# Patient Record
Sex: Female | Born: 1945 | Race: White | Marital: Married | Smoking: Current every day smoker
Health system: Southern US, Community
[De-identification: ages and names within clinical notes are randomized; demographics above are authoritative.]

## PROBLEM LIST (undated history)

## (undated) DIAGNOSIS — I10 Essential (primary) hypertension: Secondary | ICD-10-CM

## (undated) DIAGNOSIS — E7800 Pure hypercholesterolemia, unspecified: Secondary | ICD-10-CM

## (undated) HISTORY — PX: ADENOIDECTOMY: SUR15

## (undated) HISTORY — PX: TONSILLECTOMY: SUR1361

## (undated) HISTORY — PX: ABDOMINAL HYSTERECTOMY: SHX81

---

## 2011-09-03 ENCOUNTER — Other Ambulatory Visit: Payer: Self-pay | Admitting: Internal Medicine

## 2011-09-03 DIAGNOSIS — R911 Solitary pulmonary nodule: Secondary | ICD-10-CM

## 2011-09-10 ENCOUNTER — Ambulatory Visit
Admission: RE | Admit: 2011-09-10 | Discharge: 2011-09-10 | Disposition: A | Discharge status: Home / Self Care | Payer: 59 | Source: Ambulatory Visit | Attending: Internal Medicine | Admitting: Internal Medicine

## 2011-09-10 DIAGNOSIS — R911 Solitary pulmonary nodule: Secondary | ICD-10-CM

## 2012-03-26 ENCOUNTER — Emergency Department (HOSPITAL_COMMUNITY): Payer: 59

## 2012-03-26 ENCOUNTER — Encounter (HOSPITAL_COMMUNITY): Payer: Self-pay | Admitting: Family Medicine

## 2012-03-26 ENCOUNTER — Inpatient Hospital Stay (HOSPITAL_COMMUNITY)
Admission: EM | Admit: 2012-03-26 | Discharge: 2012-03-28 | DRG: 641 | Disposition: A | Discharge status: Home / Self Care | Payer: 59 | Attending: Family Medicine | Admitting: Family Medicine

## 2012-03-26 DIAGNOSIS — E871 Hypo-osmolality and hyponatremia: Principal | ICD-10-CM | POA: Diagnosis present

## 2012-03-26 DIAGNOSIS — R55 Syncope and collapse: Secondary | ICD-10-CM | POA: Diagnosis present

## 2012-03-26 DIAGNOSIS — E785 Hyperlipidemia, unspecified: Secondary | ICD-10-CM | POA: Diagnosis present

## 2012-03-26 DIAGNOSIS — I951 Orthostatic hypotension: Secondary | ICD-10-CM | POA: Diagnosis present

## 2012-03-26 DIAGNOSIS — R21 Rash and other nonspecific skin eruption: Secondary | ICD-10-CM | POA: Diagnosis present

## 2012-03-26 DIAGNOSIS — E869 Volume depletion, unspecified: Secondary | ICD-10-CM | POA: Diagnosis present

## 2012-03-26 DIAGNOSIS — I10 Essential (primary) hypertension: Secondary | ICD-10-CM | POA: Diagnosis present

## 2012-03-26 DIAGNOSIS — Z7982 Long term (current) use of aspirin: Secondary | ICD-10-CM

## 2012-03-26 DIAGNOSIS — F17200 Nicotine dependence, unspecified, uncomplicated: Secondary | ICD-10-CM | POA: Diagnosis present

## 2012-03-26 DIAGNOSIS — Z79899 Other long term (current) drug therapy: Secondary | ICD-10-CM

## 2012-03-26 DIAGNOSIS — E7800 Pure hypercholesterolemia, unspecified: Secondary | ICD-10-CM | POA: Diagnosis present

## 2012-03-26 HISTORY — DX: Essential (primary) hypertension: I10

## 2012-03-26 HISTORY — DX: Pure hypercholesterolemia, unspecified: E78.00

## 2012-03-26 LAB — COMPREHENSIVE METABOLIC PANEL
ALT: 11 U/L (ref 0–35)
AST: 16 U/L (ref 0–37)
Albumin: 4 g/dL (ref 3.5–5.2)
Alkaline Phosphatase: 86 U/L (ref 39–117)
GFR calc Af Amer: >90 mL/min (ref >90)
Glucose, Bld: 132 mg/dL — ABNORMAL HIGH (ref 70–99)
Potassium: 4.9 mEq/L (ref 3.5–5.1)
Sodium: 125 mEq/L — ABNORMAL LOW (ref 135–145)
Total Protein: 6.8 g/dL (ref 6.0–8.3)

## 2012-03-26 LAB — POCT I-STAT TROPONIN I: Troponin i, poc: 0 ng/mL (ref 0.00–0.08)

## 2012-03-26 LAB — CBC WITH DIFFERENTIAL/PLATELET
Basophils Absolute: 0.1 10*3/uL (ref 0.0–0.1)
Eosinophils Absolute: 0.5 10*3/uL (ref 0.0–0.7)
Lymphs Abs: 1.7 10*3/uL (ref 0.7–4.0)
MCH: 33.6 pg (ref 26.0–34.0)
Neutrophils Relative %: 75 % (ref 43–77)
Platelets: 343 10*3/uL (ref 150–400)
RBC: 4.31 MIL/uL (ref 3.87–5.11)
RDW: 12.1 % (ref 11.5–15.5)
WBC: 13.7 10*3/uL — ABNORMAL HIGH (ref 4.0–10.5)

## 2012-03-26 MED ORDER — SODIUM CHLORIDE 0.9 % IV BOLUS (SEPSIS)
1000.0000 mL | Freq: Once | INTRAVENOUS | Status: AC
  Administered 2012-03-26: 1000 mL via INTRAVENOUS

## 2012-03-26 NOTE — ED Notes (Signed)
Pt returned from radiology.

## 2012-03-26 NOTE — ED Notes (Signed)
Per EMS, pt had syncopal episode at approx 1945 upon standing up from sitting position. Husband witnessed episode and told EMS that pt did not hit head. Husband stated that after the fall, pt exhibited decerebrate posturing and "took a few minutes to become fully conscious." Per EMS pt in LBBB on 12 lead. Pt has red rash all over body. Positive ETOH, per EMS pt drank a six pack this afternoon. Pt alert and interactive on arrival.  EMS placed an 18gauge to the left AC fluids running.  O2 sats 98% on RA, CBG=120,  NIH was 0.

## 2012-03-26 NOTE — ED Notes (Signed)
Dr. Tanna Savoy at bedside for assessment

## 2012-03-26 NOTE — ED Provider Notes (Signed)
History     CSN: [id number]  Arrival date & time 03/26/12  2039   First MD Initiated Contact with Patient 03/26/12 2101      Chief Complaint  Patient presents with  . Loss of Consciousness    (Consider location/radiation/quality/duration/timing/severity/associated sxs/prior treatment) HPI Comments: Patient states she started to feel warm and hot all over and mildly generally weak. She decided she would stand up and go to sit in her chair and not last thing she remembers. Husband found her lying on the floor unresponsive for a short period of time he states no more than a minute. She states come back around and then tightened her arms and was making a gurgling noise with her mouth. This lasted for approximately 20 seconds and then she started to respond and was able to grip his hands bilaterally and answer questions. She states she has drinks 6 beers this afternoon which is not unusual for her she had not been eating regularly because of the power being out  Patient is a 67 y.o. female presenting with syncope. The history is provided by the patient.  Loss of Consciousness  This is a new problem. The current episode started less than 1 hour ago. The problem occurs constantly. The problem has been resolved. She lost consciousness for a period of less than one minute. The problem is associated with standing up. Associated symptoms include diaphoresis, dizziness, light-headedness and weakness. Pertinent negatives include back pain, bladder incontinence, bowel incontinence, chest pain, confusion, fever, focal weakness, headaches, nausea, palpitations, slurred speech, visual change and vomiting. She has tried relaxation for the symptoms. The treatment provided significant relief. Her past medical history is significant for HTN. Her past medical history does not include CAD, CVA, DM or seizures.    Past Medical History  Diagnosis Date  . Hypertension   . High cholesterol     Past Surgical History   Procedure Laterality Date  . Tonsillectomy    . Abdominal hysterectomy    . Adenoidectomy      No family history on file.  History  Substance Use Topics  . Smoking status: Current Every Day Smoker  . Smokeless tobacco: Not on file  . Alcohol Use: 0.0 oz/week     Comment: Daily drinker, states "sometimes I don't feel like drinking, but I usually have a few every night"    OB History   Grav Para Term Preterm Abortions TAB SAB Ect Mult Living                  Review of Systems  Constitutional: Positive for diaphoresis. Negative for fever.  Eyes: Negative for photophobia and visual disturbance.  Respiratory: Negative for cough and shortness of breath.   Cardiovascular: Positive for syncope. Negative for chest pain and palpitations.  Gastrointestinal: Negative for nausea, vomiting and bowel incontinence.  Genitourinary: Negative for bladder incontinence.  Musculoskeletal: Negative for back pain.  Skin: Positive for rash.       Rash all over the with itching.  Treated for scabies at PCP which did not help and recently started keflex which has improved rash.  Neurological: Positive for dizziness, weakness and light-headedness. Negative for focal weakness and headaches.  Psychiatric/Behavioral: Negative for confusion.  All other systems reviewed and are negative.    Allergies  Review of patient's allergies indicates no known allergies.  Home Medications   Current Outpatient Rx  Name  Route  Sig  Dispense  Refill  . aspirin 81 MG tablet   Oral  Take 81 mg by mouth daily.         . cephALEXin (KEFLEX) 250 MG capsule   Oral   Take 250 mg by mouth 3 (three) times daily. Started medication on Wednesday 02-25-12. Take for 10 days         . cholecalciferol (VITAMIN D) 1000 UNITS tablet   Oral   Take 1,000 Units by mouth 2 (two) times daily.         Marland Kitchen CROMOLYN SODIUM PO   Oral   Take 1 tablet by mouth daily.         . hydrOXYzine (ATARAX/VISTARIL) 25 MG  tablet   Oral   Take 25 mg by mouth daily as needed for itching. For itching         . meloxicam (MOBIC) 15 MG tablet   Oral   Take 15 mg by mouth daily as needed for pain. For pain/muscle aches           BP 103/62  Pulse 91  Temp(Src) 98.5 F (36.9 C) (Oral)  Resp 18  Physical Exam  Nursing note and vitals reviewed. Constitutional: She is oriented to person, place, and time. She appears well-developed and well-nourished. No distress.  HENT:  Head: Normocephalic and atraumatic.  Mouth/Throat: Oropharynx is clear and moist.  Eyes: Conjunctivae and EOM are normal. Pupils are equal, round, and reactive to light.  Neck: Normal range of motion. Neck supple.  Cardiovascular: Normal rate, regular rhythm and intact distal pulses.   No murmur heard. Pulmonary/Chest: Effort normal. No respiratory distress. She has no wheezes. She has rales.  Abdominal: Soft. She exhibits no distension. There is no tenderness. There is no rebound and no guarding.  Musculoskeletal: Normal range of motion. She exhibits no edema and no tenderness.  Neurological: She is alert and oriented to person, place, and time.  Skin: Skin is warm and dry. Rash noted. Rash is maculopapular and pustular. No erythema.  Macular papular, pustular weeping rash involving bilateral upper extremities, chest, back and bilateral lower extremity  Psychiatric: She has a normal mood and affect. Her behavior is normal.    ED Course  Procedures (including critical care time)  Labs Reviewed  CBC WITH DIFFERENTIAL - Abnormal; Notable for the following:    WBC 13.7 (*)    MCHC 36.3 (*)    Neutro Abs 10.3 (*)    Monocytes Absolute 1.1 (*)    All other components within normal limits  COMPREHENSIVE METABOLIC PANEL - Abnormal; Notable for the following:    Sodium 125 (*)    Chloride 88 (*)    Glucose, Bld 132 (*)    Total Bilirubin 0.2 (*)    GFR calc non Af Amer 87 (*)    All other components within normal limits  ETHANOL   URINALYSIS, ROUTINE W REFLEX MICROSCOPIC  POCT I-STAT TROPONIN I   Dg Chest 2 View  03/26/2012  *RADIOLOGY REPORT*  Clinical Data: Syncope; weakness and chest pain.  History of smoking.  CHEST - 2 VIEW  Comparison: Chest radiograph performed 09/03/2011, and CT of the chest performed 09/10/2011  Findings: The lungs are well-aerated.  Minimal vague peripheral opacities are similar in appearance to the prior studies; this could reflect chronic underlying interstitial lung disease and fibrotic change, as previously noted.  No acute superimposed airspace consolidation is seen.  There is no evidence of pleural effusion or pneumothorax.  The heart is normal in size; the mediastinal contour is within normal limits.  No acute osseous  abnormalities are seen.  IMPRESSION: Stable appearance to minimal vague peripheral opacities; this could reflect chronic underlying interstitial lung disease and fibrotic change, as previously noted.  No acute superimposed airspace consolidation seen.   Original Report Authenticated By: Tonia Ghent, M.D.    Ct Head Wo Contrast  03/26/2012  *RADIOLOGY REPORT*  Clinical Data:  Syncope  CT HEAD WITHOUT CONTRAST  Technique:  Contiguous axial images were obtained from the base of the skull through the vertex without contrast.  Comparison: None.  Findings: No acute intracranial hemorrhage, acute infarction, mass lesion, mass effect, midline shift or hydrocephalus.  Gray-white differentiation is preserved throughout. The globes and orbits are within normal limits.  No focal soft tissue or calvarial abnormality.  There are atherosclerotic vascular calcifications within the bilateral cavernous carotid arteries.  Mastoid air cells and paranasal sinuses are well-aerated.  IMPRESSION:  1.  Negative CT scan of the head. 2.  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   Original Report Authenticated By: Malachy Moan, M.D.      Date: 03/26/2012  Rate: 70  Rhythm: normal  sinus rhythm  QRS Axis: right  Intervals: PR shortened  ST/T Wave abnormalities: normal  Conduction Disutrbances:none  Narrative Interpretation:   Old EKG Reviewed: none available   1. Syncope   2. Hyponatremia       MDM  Patient with an episode of loss of consciousness today x2. And she states she had drank 6 beers today and had knee much because the power was out. She started to feel weak and lightheaded and stood up announced last thing she remembered. Husband found her face down and he sat her up at which time she came back around and then he states he's she kind her arms and when out again. She came around in 20-30 seconds. Patient has a history of syncope or seizures. She states she typically drinks 6 beers daily and that is not unusual for her. She's recently been on Atarax and Keflex for a skin rash which she states is improving. She denies any fever or feeling abnormal today. Blood sugar on arrival was within normal limits at 120. Patient has no known cardiac history however she is hypertension, hyper cholesterolemia, and tobacco abuser.  Feel most likely this was syncopal event lower suspicion for seizure.  CBC, CMP, UA, EtOH, troponin, chest x-ray pending. EKG showed a mild shortened PR interval but otherwise within normal limits.  Patient's blood pressure is been borderline low. She was given IV fluids.        Gwyneth Sprout, MD 03/26/12 2310

## 2012-03-27 ENCOUNTER — Encounter (HOSPITAL_COMMUNITY): Payer: Self-pay | Admitting: *Deleted

## 2012-03-27 LAB — URINALYSIS, ROUTINE W REFLEX MICROSCOPIC
Bilirubin Urine: NEGATIVE
Glucose, UA: NEGATIVE mg/dL
Hgb urine dipstick: NEGATIVE
Ketones, ur: NEGATIVE mg/dL
Specific Gravity, Urine: 1.006 (ref 1.005–1.030)
pH: 6 (ref 5.0–8.0)

## 2012-03-27 LAB — BASIC METABOLIC PANEL
BUN: 8 mg/dL (ref 6–23)
CO2: 26 mEq/L (ref 19–32)
Calcium: 9.1 mg/dL (ref 8.4–10.5)
Creatinine, Ser: 0.69 mg/dL (ref 0.50–1.10)
GFR calc non Af Amer: 89 mL/min — ABNORMAL LOW (ref >90)
Glucose, Bld: 103 mg/dL — ABNORMAL HIGH (ref 70–99)

## 2012-03-27 LAB — TROPONIN I: Troponin I: <0.3 ng/mL (ref <0.30)

## 2012-03-27 MED ORDER — FOLIC ACID 1 MG PO TABS
1.0000 mg | ORAL_TABLET | Freq: Every day | ORAL | Status: DC
  Administered 2012-03-27 – 2012-03-28 (×2): 1 mg via ORAL
  Filled 2012-03-27 (×2): qty 1

## 2012-03-27 MED ORDER — LORAZEPAM 2 MG/ML IJ SOLN: 1.0000 mg | Freq: Four times a day (QID) | INTRAMUSCULAR | Status: DC | PRN

## 2012-03-27 MED ORDER — ASPIRIN 81 MG PO TABS: 81.0000 mg | ORAL_TABLET | Freq: Every day | ORAL | Status: DC

## 2012-03-27 MED ORDER — LORAZEPAM 2 MG/ML IJ SOLN: 0.0000 mg | Freq: Two times a day (BID) | INTRAMUSCULAR | Status: DC

## 2012-03-27 MED ORDER — ADULT MULTIVITAMIN W/MINERALS CH
1.0000 | ORAL_TABLET | Freq: Every day | ORAL | Status: DC
  Administered 2012-03-27 – 2012-03-28 (×2): 1 via ORAL
  Filled 2012-03-27 (×2): qty 1

## 2012-03-27 MED ORDER — LORAZEPAM 1 MG PO TABS: 1.0000 mg | ORAL_TABLET | Freq: Four times a day (QID) | ORAL | Status: DC | PRN

## 2012-03-27 MED ORDER — ASPIRIN 81 MG PO CHEW
81.0000 mg | CHEWABLE_TABLET | Freq: Every day | ORAL | Status: DC
  Administered 2012-03-27 – 2012-03-28 (×2): 81 mg via ORAL
  Filled 2012-03-27: qty 1

## 2012-03-27 MED ORDER — HYDROXYZINE HCL 25 MG PO TABS: 25.0000 mg | ORAL_TABLET | Freq: Every day | ORAL | Status: DC | PRN

## 2012-03-27 MED ORDER — SODIUM CHLORIDE 0.9 % IV SOLN
INTRAVENOUS | Status: DC
  Administered 2012-03-27: 05:00:00 via INTRAVENOUS

## 2012-03-27 MED ORDER — THIAMINE HCL 100 MG/ML IJ SOLN
100.0000 mg | Freq: Every day | INTRAMUSCULAR | Status: DC
  Filled 2012-03-27 (×2): qty 1

## 2012-03-27 MED ORDER — ENOXAPARIN SODIUM 40 MG/0.4ML ~~LOC~~ SOLN
40.0000 mg | SUBCUTANEOUS | Status: DC
  Administered 2012-03-27 – 2012-03-28 (×2): 40 mg via SUBCUTANEOUS
  Filled 2012-03-27 (×2): qty 0.4

## 2012-03-27 MED ORDER — VITAMIN B-1 100 MG PO TABS
100.0000 mg | ORAL_TABLET | Freq: Every day | ORAL | Status: DC
  Administered 2012-03-27 – 2012-03-28 (×2): 100 mg via ORAL
  Filled 2012-03-27 (×2): qty 1

## 2012-03-27 MED ORDER — ONDANSETRON HCL 4 MG/2ML IJ SOLN: 4.0000 mg | Freq: Four times a day (QID) | INTRAMUSCULAR | Status: DC | PRN

## 2012-03-27 MED ORDER — ONDANSETRON HCL 4 MG PO TABS: 4.0000 mg | ORAL_TABLET | Freq: Four times a day (QID) | ORAL | Status: DC | PRN

## 2012-03-27 MED ORDER — LORAZEPAM 2 MG/ML IJ SOLN: 0.0000 mg | Freq: Four times a day (QID) | INTRAMUSCULAR | Status: DC

## 2012-03-27 MED ORDER — SODIUM CHLORIDE 0.9 % IJ SOLN
3.0000 mL | Freq: Two times a day (BID) | INTRAMUSCULAR | Status: DC
  Administered 2012-03-27 – 2012-03-28 (×2): 3 mL via INTRAVENOUS

## 2012-03-27 NOTE — Progress Notes (Signed)
Utilization Review Completed.   Jasline Buskirk Tucker, RN, BSN Nurse Case Manager  [phone]  

## 2012-03-27 NOTE — Progress Notes (Signed)
PROGRESS NOTE  Niylah Hassan ZOX:[id number] DOB: 1945/02/10 DOA: 03/26/2012 PCP: No primary Emric Kowalewski on file.  Brief narrative: 67 yr old CF admitted with syncopal episode in a setting of diuretic use/Daily etoh use as well  Past medical history-As per Problem list Chart reviewed as below- No prior medical h/o  Consultants:  None currently  Procedures:  Ct head 3.7.14 neg for any acute issue  Cxr 2 view 3.7  Antibiotics:  None   Subjective  Patient doing well. States that she was seated playing computer games at home, then she got up and Felt diaphoretic weak and like she was going pass out She states she went to lay her feet up and then passed out. Her husband heard the commotion and came over and she got up and then had another spell where she lost consciousness briefly for 10/52 She was incontinent of urine She had no postictal state or any other concerns   Objective    Interim History: NAD  Telemetry: Normal sinus rhythm with PACs  Objective: Filed Vitals:   03/27/12 0000 03/27/12 0030 03/27/12 0100 03/27/12 0500  BP: 105/68 101/53 123/63 108/61  Pulse: 85 83 89   Temp:   99.5 F (37.5 C) 99.3 F (37.4 C)  TempSrc:   Oral Oral  Resp: 15 15 16    Height:   5\' 2"  (1.575 m)   Weight:   67.495 kg (148 lb 12.8 oz)   SpO2: 96% 96% 97% 94%    Intake/Output Summary (Last 24 hours) at 03/27/12 1317 Last data filed at 03/27/12 0700  Gross per 24 hour  Intake    240 ml  Output    800 ml  Net   -560 ml    Exam:  General: Pleasant oriented Caucasian female no apparent distress Cardiovascular: S1-S2 regular rate rhythm no murmur Respiratory: Clinically clear Abdomen: Soft nontender nondistended Skin no lower extremity edema Neuro is intact  Data Reviewed: Basic Metabolic Panel:  Recent Labs Lab 03/26/12 2115 03/27/12 0655  NA 125* 131*  K 4.9 4.1  CL 88* 95*  CO2 25 26  GLUCOSE 132* 103*  BUN 11 8  CREATININE 0.76 0.69  CALCIUM 9.4 9.1    Liver Function Tests:  Recent Labs Lab 03/26/12 2115  AST 16  ALT 11  ALKPHOS 86  BILITOT 0.2*  PROT 6.8  ALBUMIN 4.0   No results found for this basename: LIPASE, AMYLASE,  in the last 168 hours No results found for this basename: AMMONIA,  in the last 168 hours CBC:  Recent Labs Lab 03/26/12 2115  WBC 13.7*  NEUTROABS 10.3*  HGB 14.5  HCT 39.9  MCV 92.6  PLT 343   Cardiac Enzymes:  Recent Labs Lab 03/27/12 0205 03/27/12 0655  TROPONINI <0.30 <0.30   BNP: No components found with this basename: POCBNP,  CBG: No results found for this basename: GLUCAP,  in the last 168 hours  No results found for this or any previous visit (from the past 240 hour(s)).   Studies:              All Imaging reviewed and is as per above notation   Scheduled Meds: . aspirin  81 mg Oral Daily  . enoxaparin (LOVENOX) injection  40 mg Subcutaneous Q24H  . folic acid  1 mg Oral Daily  . LORazepam  0-4 mg Intravenous Q6H   Followed by  . [START ON 03/29/2012] LORazepam  0-4 mg Intravenous Q12H  . multivitamin with minerals  1  tablet Oral Daily  . sodium chloride  3 mL Intravenous Q12H  . thiamine  100 mg Oral Daily   Or  . thiamine  100 mg Intravenous Daily   Continuous Infusions: . sodium chloride 125 mL/hr at 03/27/12 0513     Assessment/Plan: 1. Likely orthostatic versus vasovagal syncope-patient's HCTZ has been discontinued. We will get orthostatic blood pressures.  I am not completely sure if this is not related to potential seizure activity given the urinary incontinence but as it was no tongue biting or further issues we will just monitor for now-continue saline 75 cc per hour-her telemetry does not show any concerns for arrhythmia causing this and she is normal sensory 2. Rash-potentially secondary to scabies-states that she's been using a new detergent recently and start over the left lower extremity and then progressed to the rest of the body closing the back-continue  Atarax 25 daily if this does not improve she needs to see a dermatologist 3. Ethanol habituation-C.IWa neg-she is on a recreational drinker therefore discontinue monitor 4. Borderline hypotension-probably constitutional.  Code Status: Full Family Communication: Long discussion about side with husband who is an EMT Disposition Plan: Inpatient   Pleas Koch, MD  [facility] Pager [phone] 03/27/2012, 1:17 PM    LOS: 1 day

## 2012-03-27 NOTE — H&P (Signed)
[facility] History and Physical  Beth Hinton ZOX:[id number] DOB: Mar 10, 1945    PCP:  None  Chief Complaint: Syncope  HPI: Beth Hinton is an 67 y.o. female with history of hypertension, hyperlipidemia, daily beer drinker (up to 6 beer per day, diuretic for hypertension, presents to the emergency room as she had a syncopal episode.  She was feeling lightheadedness and fainted. The loss of consciousness was quite transient.  She did not have any chest pain, palpitation, headache, or any neurological complaints. Evaluation in the emergency room included an EKG which showed normal sinus rhythm without any acute ST-T changes, a serum sodium of 125, with normal renal function tests. She does have mild leukocytosis with a white count of 13.7 thousand. Her chest x-ray showed no acute infiltrate. She also had a CT of the head which was negative as well.  She also had a burn on her left lower extremity and was treated with Silvadene cream. She has a intense itchy rash all over her body suspicious for scabies. She had one course of treatment with Kwell solution. The rash has crusted. The itching is still there. Hospitalist was asked to admit her for syncopal event along with hyponatremia.  Rewiew of Systems:  Constitutional: Negative for malaise, fever and chills. No significant weight loss or weight gain Eyes: Negative for eye pain, redness and discharge, diplopia, visual changes, or flashes of light. ENMT: Negative for ear pain, hoarseness, nasal congestion, sinus pressure and sore throat. No headaches; tinnitus, drooling, or problem swallowing. Cardiovascular: Negative for chest pain, palpitations, diaphoresis, dyspnea and peripheral edema. ; No orthopnea, PND Respiratory: Negative for cough, hemoptysis, wheezing and stridor. No pleuritic chestpain. Gastrointestinal: Negative for nausea, vomiting, diarrhea, constipation, abdominal pain, melena, blood in stool, hematemesis, jaundice and rectal  bleeding.    Genitourinary: Negative for frequency, dysuria, incontinence,flank pain and hematuria; Musculoskeletal: Negative for back pain and neck pain. Negative for swelling and trauma.;  Skin: . Negative for pruritus, rash, abrasions, bruising and skin lesion.; ulcerations Neuro: Negative for headache, and neck stiffness. Negative for weakness,  extremity weakness, burning feet, involuntary movement, seizure and syncope.  Psych: negative for anxiety, depression, insomnia, tearfulness, panic attacks, hallucinations, paranoia, suicidal or homicidal ideation    Past Medical History  Diagnosis Date  . Hypertension   . High cholesterol     Past Surgical History  Procedure Laterality Date  . Tonsillectomy    . Abdominal hysterectomy    . Adenoidectomy      Medications:  HOME MEDS: Prior to Admission medications   Medication Sig Start Date End Date Taking? Authorizing Provider  aspirin 81 MG tablet Take 81 mg by mouth daily.   Yes Historical Provider, MD  cephALEXin (KEFLEX) 250 MG capsule Take 250 mg by mouth 3 (three) times daily. Started medication on Wednesday 02-25-12. Take for 10 days   Yes Historical Provider, MD  cholecalciferol (VITAMIN D) 1000 UNITS tablet Take 1,000 Units by mouth 2 (two) times daily.   Yes Historical Provider, MD  CROMOLYN SODIUM PO Take 1 tablet by mouth daily.   Yes Historical Provider, MD  hydrOXYzine (ATARAX/VISTARIL) 25 MG tablet Take 25 mg by mouth daily as needed for itching. For itching   Yes Historical Provider, MD  meloxicam (MOBIC) 15 MG tablet Take 15 mg by mouth daily as needed for pain. For pain/muscle aches   Yes Historical Provider, MD     Allergies:  No Known Allergies  Social History:   reports that she has been smoking.  She has never used smokeless tobacco. She reports that  drinks alcohol. Her drug history is not on file.  Family History: No family history on file.   Physical Exam: Filed Vitals:   03/26/12 2315 03/26/12  2345 03/27/12 0000 03/27/12 0030  BP: 91/49 104/49 105/68 101/53  Pulse: 80 73 85 83  Temp:      TempSrc:      Resp: 15 18 15 15   SpO2: 96% 96% 96% 96%   Blood pressure 101/53, pulse 83, temperature 98.5 F (36.9 C), temperature source Oral, resp. rate 15, SpO2 96.00%.  GEN:  Pleasant  patient lying in the stretcher in no acute distress; cooperative with exam. PSYCH:  alert and oriented x4; does not appear anxious or depressed; affect is appropriate. HEENT: Mucous membranes pink and anicteric; PERRLA; EOM intact; no cervical lymphadenopathy nor thyromegaly or carotid bruit; no JVD; There were no stridor. Neck is very supple. Breasts:: Not examined CHEST WALL: No tenderness CHEST: Normal respiration, clear to auscultation bilaterally.  HEART: Regular rate and rhythm.  There are no murmur, rub, or gallops.   BACK: No kyphosis or scoliosis; no CVA tenderness ABDOMEN: soft and non-tender; no masses, no organomegaly, normal abdominal bowel sounds; no pannus; no intertriginous candida. There is no rebound and no distention. Rectal Exam: Not done EXTREMITIES: No bone or joint deformity; age-appropriate arthropathy of the hands and knees; no edema; no ulcerations.  There is no calf tenderness. Genitalia: not examined PULSES: 2+ and symmetric SKIN: Normal hydration no rash or ulceration CNS: Cranial nerves 2-12 grossly intact no focal lateralizing neurologic deficit.  Speech is fluent; uvula elevated with phonation, facial symmetry and tongue midline. DTR are normal bilaterally, cerebella exam is intact, barbinski is negative and strengths are equaled bilaterally.  No sensory loss.   Labs on Admission:  Basic Metabolic Panel:  Recent Labs Lab 03/26/12 2115  NA 125*  K 4.9  CL 88*  CO2 25  GLUCOSE 132*  BUN 11  CREATININE 0.76  CALCIUM 9.4   Liver Function Tests:  Recent Labs Lab 03/26/12 2115  AST 16  ALT 11  ALKPHOS 86  BILITOT 0.2*  PROT 6.8  ALBUMIN 4.0   No results  found for this basename: LIPASE, AMYLASE,  in the last 168 hours No results found for this basename: AMMONIA,  in the last 168 hours CBC:  Recent Labs Lab 03/26/12 2115  WBC 13.7*  NEUTROABS 10.3*  HGB 14.5  HCT 39.9  MCV 92.6  PLT 343   Cardiac Enzymes: No results found for this basename: CKTOTAL, CKMB, CKMBINDEX, TROPONINI,  in the last 168 hours  CBG: No results found for this basename: GLUCAP,  in the last 168 hours   Radiological Exams on Admission: Dg Chest 2 View  03/26/2012  *RADIOLOGY REPORT*  Clinical Data: Syncope; weakness and chest pain.  History of smoking.  CHEST - 2 VIEW  Comparison: Chest radiograph performed 09/03/2011, and CT of the chest performed 09/10/2011  Findings: The lungs are well-aerated.  Minimal vague peripheral opacities are similar in appearance to the prior studies; this could reflect chronic underlying interstitial lung disease and fibrotic change, as previously noted.  No acute superimposed airspace consolidation is seen.  There is no evidence of pleural effusion or pneumothorax.  The heart is normal in size; the mediastinal contour is within normal limits.  No acute osseous abnormalities are seen.  IMPRESSION: Stable appearance to minimal vague peripheral opacities; this could reflect chronic underlying interstitial lung disease and fibrotic  change, as previously noted.  No acute superimposed airspace consolidation seen.   Original Report Authenticated By: Tonia Ghent, M.D.    Ct Head Wo Contrast  03/26/2012  *RADIOLOGY REPORT*  Clinical Data:  Syncope  CT HEAD WITHOUT CONTRAST  Technique:  Contiguous axial images were obtained from the base of the skull through the vertex without contrast.  Comparison: None.  Findings: No acute intracranial hemorrhage, acute infarction, mass lesion, mass effect, midline shift or hydrocephalus.  Gray-white differentiation is preserved throughout. The globes and orbits are within normal limits.  No focal soft tissue or  calvarial abnormality.  There are atherosclerotic vascular calcifications within the bilateral cavernous carotid arteries.  Mastoid air cells and paranasal sinuses are well-aerated.  IMPRESSION:  1.  Negative CT scan of the head. 2.  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   Original Report Authenticated By: Malachy Moan, M.D.     EKG: Independently reviewed. Normal sinus rhythm without any acute ST-T changes   Assessment/Plan Present on Admission:  . Hyponatremia . HTN (hypertension) . Syncope and collapse . Volume depletion . Rash of entire body  PLAN:  I think the syncopal episode is likely because of volume depletion. She has been on diuretic for her hypertension and drink significant amount of alcohol daily. Her hyponatremia is likely due to beer potomania aggravated by diuretics.  I suspect the rash is scabies. Although it is treated, residual itchiness will persist. We'll admit her for syncopal workup including telemetry and rule out for MI. She will be given intravenous fluid with normal saline. I have stopped her lisinopril with HCTZ for now. She is stable, full code, and will be [facility].  Thank you for allowing me to participate in the care of this nice patient   Other plans as per orders.  Code Status: Full code   LE,PETER, MD. [facility] Pager [phone] 7pm to 7am.  03/27/2012, 1:35 AM

## 2012-03-28 LAB — BASIC METABOLIC PANEL
BUN: 7 mg/dL (ref 6–23)
Creatinine, Ser: 0.63 mg/dL (ref 0.50–1.10)
GFR calc non Af Amer: >90 mL/min (ref >90)
Glucose, Bld: 114 mg/dL — ABNORMAL HIGH (ref 70–99)
Potassium: 3.9 mEq/L (ref 3.5–5.1)

## 2012-03-28 NOTE — Discharge Summary (Signed)
Physician Discharge Summary  Beth Hinton RUE:[id number] DOB: 1945/09/13 DOA: 03/26/2012  PCP: No primary provider on file.  Admit date: 03/26/2012 Discharge date: 03/28/2012  Time spent: 20 minutes  Recommendations for Outpatient Follow-up:  1. Recommend outpatient followup for rash 2. Would discontinue all antihypertensives even she was somewhat hypotensive in the hospital 3. Please check basic metabolic panel in one to 2 weeks 4. Patient followup with primary care physician  Discharge Diagnoses:  Principal Problem:   Hyponatremia Active Problems:   HTN (hypertension)   Syncope and collapse   Volume depletion   Rash of entire body   Discharge Condition:  stable  Diet recommendation:  heart healthy   Filed Weights   03/27/12 0100 03/28/12 0635  Weight: 67.495 kg (148 lb 12.8 oz) 67.2 kg (148 lb 2.4 oz)    History of present illness:  66-yeold Caucasian female admitted 03/27/2012 with a syncopal episode. She was seated at her computer table playing a computer game  and felt a little woozy and then seemed to have fallen to the floor. No nausea consciousness patient was completely oriented but then had a second spell prompting her husband to bring her over to the hospital given she seemed a little bit more confused after the second spell.   Evaluation in the emergency room included an EKG which showed normal sinus rhythm without any acute ST-T changes, a serum sodium of 125, with normal renal function tests. She does have mild leukocytosis with a white count of 13.7 thousand. Her chest x-ray showed no acute infiltrate. She also had a CT of the head which was negative as well    Hospital Course:    1. Likely orthostatic versus vasovagal syncope-patient's Lisinopril-HCTZ has been discontinued. Borderline positive orthostatic blood pressures. I am not completely sure if this is not related to potential seizure activity given the urinary incontinence but as it was no tongue biting or  further issues she was kept on IV saline and did relatively well. She had no further episodes. Antihypertensives were discontinued 2. Hyponatremia sodium on admission was 125-patient was kept on IV saline and this increased on discharge 134. 3. Rash-potentially secondary to scabies-states that she's been using a new detergent recently and start over the left lower extremity and then progressed to the rest of the body closing the back-continue Atarax 25 daily if this does not improve she needs to see a dermatologist- suspect potentially higher dosages of Atarax may have been giving her anticholinergic effects as well 4. Ethanol habituation-C.IWa neg-she is on a recreational drinker therefore discontinue monitor 5. Borderline hypotension-probably constitutional-possibly contributed to #1   Consultants:  None currently  Procedures:  Ct head 3.7.14 neg for any acute issue  Cxr 2 view 3.7  Antibiotics:  None   Discharge Exam: Filed Vitals:   03/27/12 0500 03/27/12 1434 03/27/12 2100 03/28/12 0635  BP: 108/61 116/64 118/57 127/60  Pulse:  77 78 74  Temp: 99.3 F (37.4 C) 98.2 F (36.8 C) 99.3 F (37.4 C) 98.9 F (37.2 C)  TempSrc: Oral Oral Oral Oral  Resp:  20 18 18   Height:      Weight:    67.2 kg (148 lb 2.4 oz)  SpO2: 94% 98% 96% 97%    alert pleasant oriented no further issues at present. Has been ambulating to the bathroom and back. And nausea no vomiting no chest pain  General:  alert pleasant Caucasian female no apparent distress  Cardiovascular:  S1-S2 no murmur rub or gallop  Respiratory:  clinically clear   Discharge Instructions  Discharge Orders   Future Orders Complete By Expires     Diet - low sodium heart healthy  As directed     Increase activity slowly  As directed         Medication List    STOP taking these medications       cephALEXin 250 MG capsule  Commonly known as:  KEFLEX      TAKE these medications       aspirin 81 MG tablet  Take 81  mg by mouth daily.     cholecalciferol 1000 UNITS tablet  Commonly known as:  VITAMIN D  Take 1,000 Units by mouth 2 (two) times daily.     CROMOLYN SODIUM PO  Take 1 tablet by mouth daily.     hydrOXYzine 25 MG tablet  Commonly known as:  ATARAX/VISTARIL  Take 25 mg by mouth daily as needed for itching. For itching     meloxicam 15 MG tablet  Commonly known as:  MOBIC  Take 15 mg by mouth daily as needed for pain. For pain/muscle aches          The results of significant diagnostics from this hospitalization (including imaging, microbiology, ancillary and laboratory) are listed below for reference.    Significant Diagnostic Studies: Dg Chest 2 View  03/26/2012  *RADIOLOGY REPORT*  Clinical Data: Syncope; weakness and chest pain.  History of smoking.  CHEST - 2 VIEW  Comparison: Chest radiograph performed 09/03/2011, and CT of the chest performed 09/10/2011  Findings: The lungs are well-aerated.  Minimal vague peripheral opacities are similar in appearance to the prior studies; this could reflect chronic underlying interstitial lung disease and fibrotic change, as previously noted.  No acute superimposed airspace consolidation is seen.  There is no evidence of pleural effusion or pneumothorax.  The heart is normal in size; the mediastinal contour is within normal limits.  No acute osseous abnormalities are seen.  IMPRESSION: Stable appearance to minimal vague peripheral opacities; this could reflect chronic underlying interstitial lung disease and fibrotic change, as previously noted.  No acute superimposed airspace consolidation seen.   Original Report Authenticated By: Tonia Ghent, M.D.    Ct Head Wo Contrast  03/26/2012  *RADIOLOGY REPORT*  Clinical Data:  Syncope  CT HEAD WITHOUT CONTRAST  Technique:  Contiguous axial images were obtained from the base of the skull through the vertex without contrast.  Comparison: None.  Findings: No acute intracranial hemorrhage, acute infarction,  mass lesion, mass effect, midline shift or hydrocephalus.  Gray-white differentiation is preserved throughout. The globes and orbits are within normal limits.  No focal soft tissue or calvarial abnormality.  There are atherosclerotic vascular calcifications within the bilateral cavernous carotid arteries.  Mastoid air cells and paranasal sinuses are well-aerated.  IMPRESSION:  1.  Negative CT scan of the head. 2.  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   Original Report Authenticated By: Malachy Moan, M.D.     Microbiology: No results found for this or any previous visit (from the past 240 hour(s)).   Labs: Basic Metabolic Panel:  Recent Labs Lab 03/26/12 2115 03/27/12 0655 03/28/12 0610  NA 125* 131* 134*  K 4.9 4.1 3.9  CL 88* 95* 99  CO2 25 26 24   GLUCOSE 132* 103* 114*  BUN 11 8 7   CREATININE 0.76 0.69 0.63  CALCIUM 9.4 9.1 8.6   Liver Function Tests:  Recent Labs Lab 03/26/12 2115  AST 16  ALT 11  ALKPHOS 86  BILITOT 0.2*  PROT 6.8  ALBUMIN 4.0   No results found for this basename: LIPASE, AMYLASE,  in the last 168 hours No results found for this basename: AMMONIA,  in the last 168 hours CBC:  Recent Labs Lab 03/26/12 2115  WBC 13.7*  NEUTROABS 10.3*  HGB 14.5  HCT 39.9  MCV 92.6  PLT 343   Cardiac Enzymes:  Recent Labs Lab 03/27/12 0205 03/27/12 0655 03/27/12 1243  TROPONINI <0.30 <0.30 <0.30   BNP: BNP (last 3 results) No results found for this basename: PROBNP,  in the last 8760 hours CBG: No results found for this basename: GLUCAP,  in the last 168 hours     Signed:  Rhetta Mura  [facility] 03/28/2012, 9:24 AM

## 2012-03-28 NOTE — Progress Notes (Signed)
Received order to discharge patient to home. IV removed, reviewed discharge instructions with patient. Copy of discharge instructions given to patient.

## 2012-03-29 LAB — TSH: TSH: 1.106 u[IU]/mL (ref 0.350–4.500)

## 2012-03-30 ENCOUNTER — Ambulatory Visit
Admission: RE | Admit: 2012-03-30 | Discharge: 2012-03-30 | Disposition: A | Discharge status: Home / Self Care | Payer: 59 | Source: Ambulatory Visit | Attending: Internal Medicine | Admitting: Internal Medicine

## 2012-03-30 ENCOUNTER — Other Ambulatory Visit: Payer: Self-pay | Admitting: Internal Medicine

## 2012-03-30 DIAGNOSIS — R609 Edema, unspecified: Secondary | ICD-10-CM

## 2013-10-12 IMAGING — US US EXTREM LOW VENOUS*L*
1 series · 14 of 24 positions shown · non-contrast
Comparison: None.

CLINICAL DATA: Left leg edema

LEFT LOWER EXTREMITY VENOUS DUPLEX ULTRASOUND
TECHNIQUE: Gray-scale sonography with graded compression, as well
as color Doppler and duplex ultrasound, were performed to evaluate
the deep venous system of the lower extremity from the level of the
common femoral vein through the popliteal and proximal calf veins.
Spectral Doppler was utilized to evaluate flow at rest and with
distal augmentation maneuvers.

[Series 1: us extrem low venous*left* · 14 of 29 slices shown]
[im 1/29]
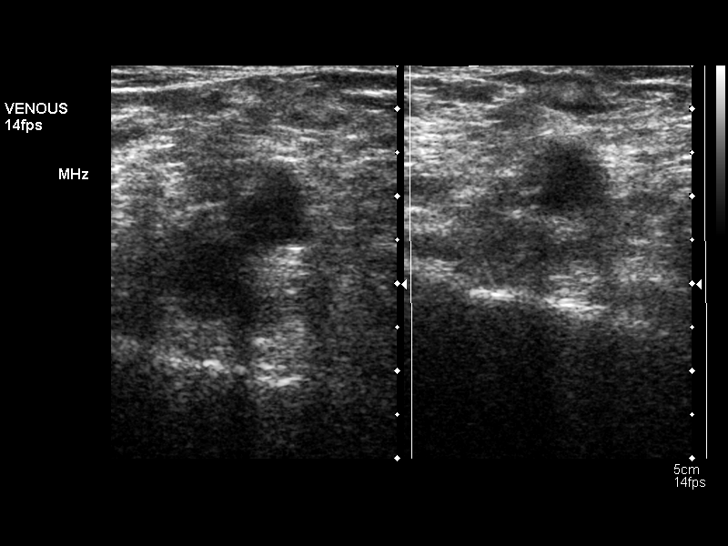
[im 3/29]
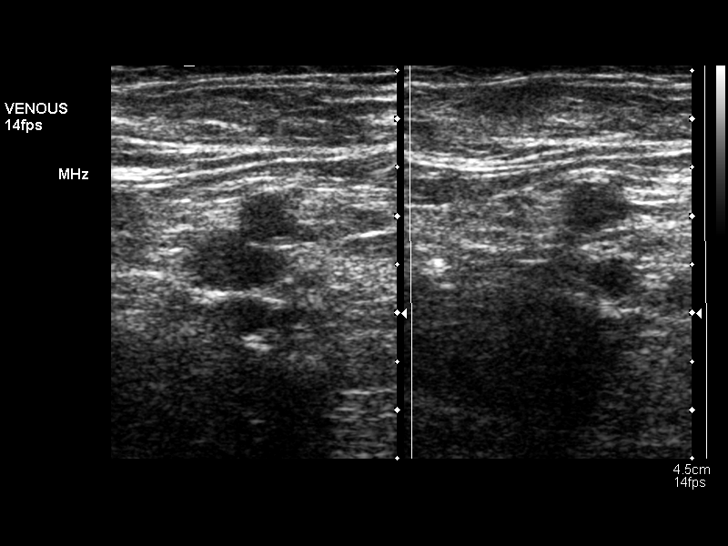
[im 5/29]
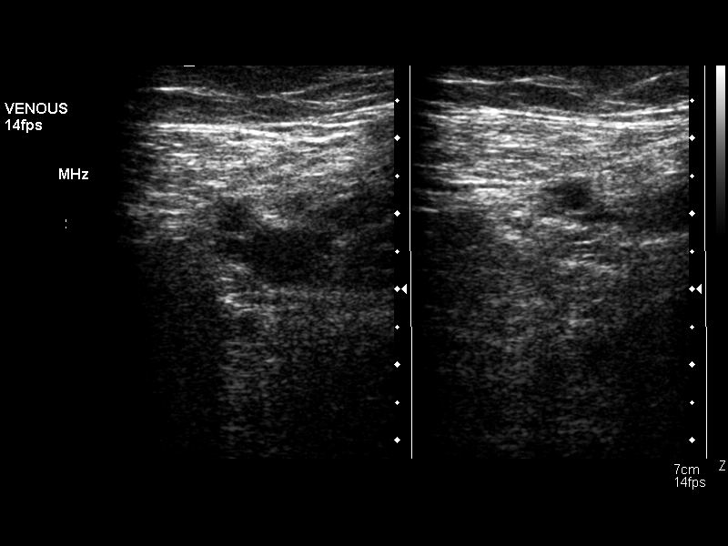
[im 8/29]
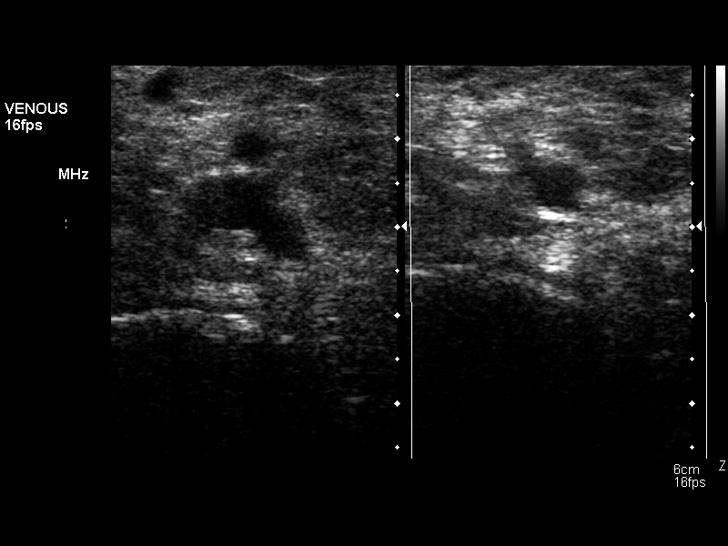
[im 9/29]
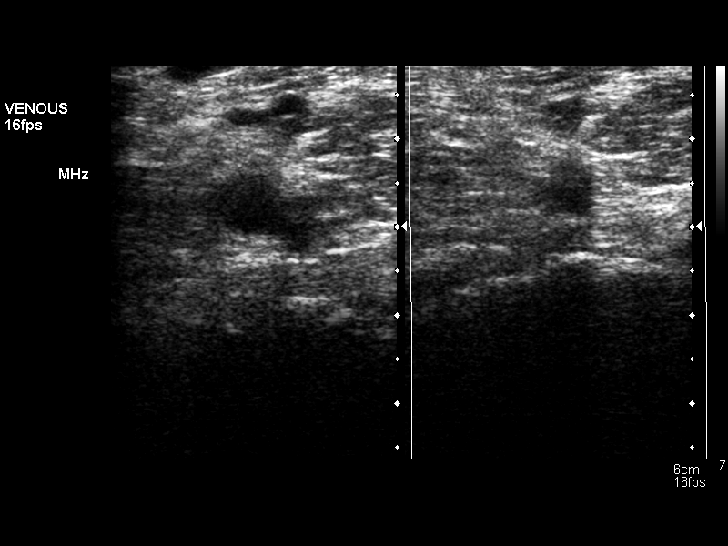
[im 11/29]
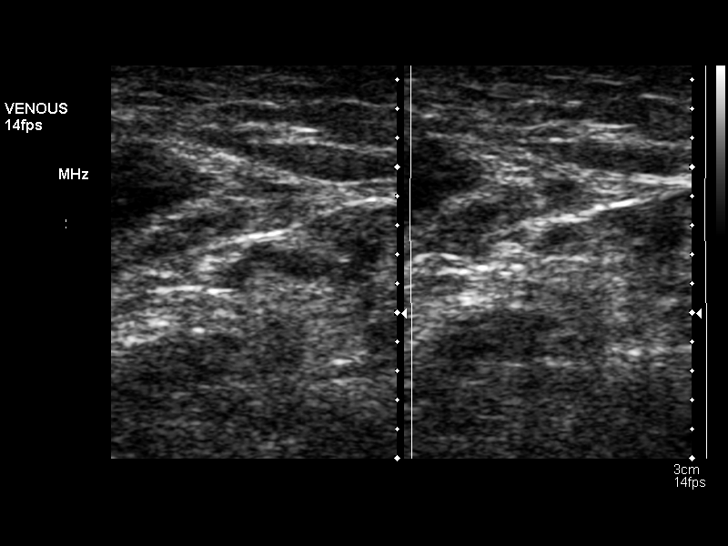
[im 14/29]
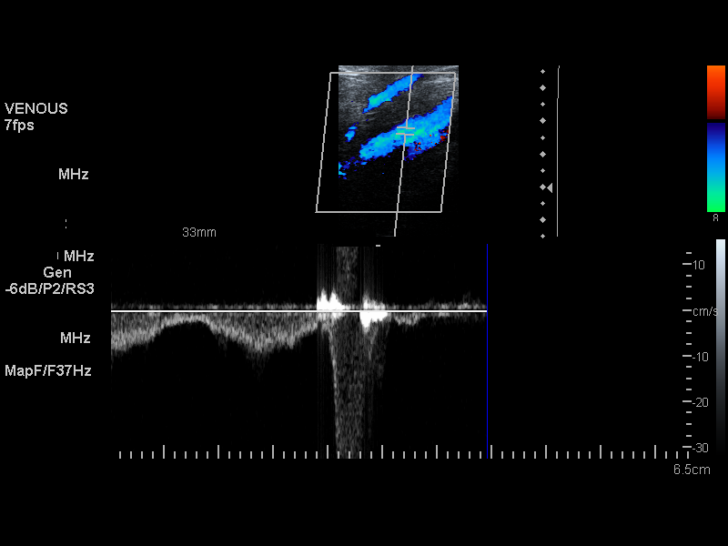
[im 15/29]
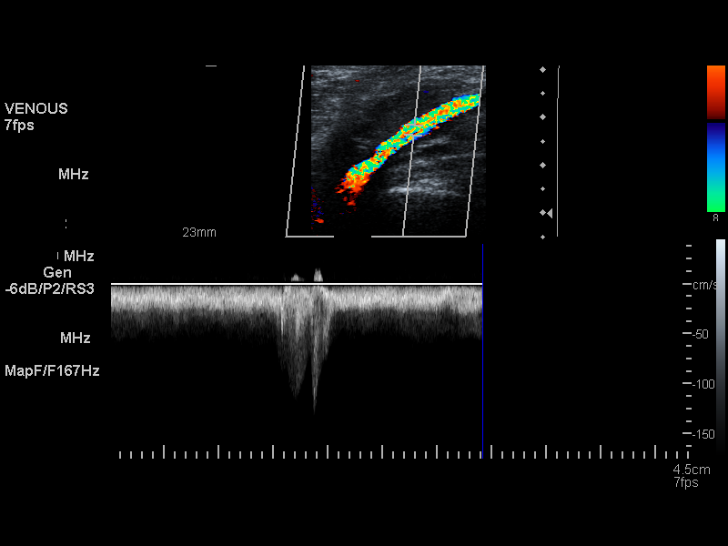
[im 18/29]
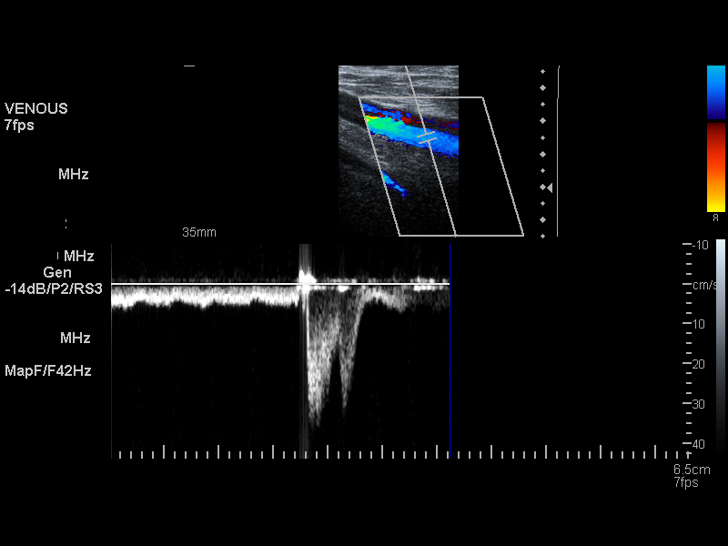
[im 20/29]
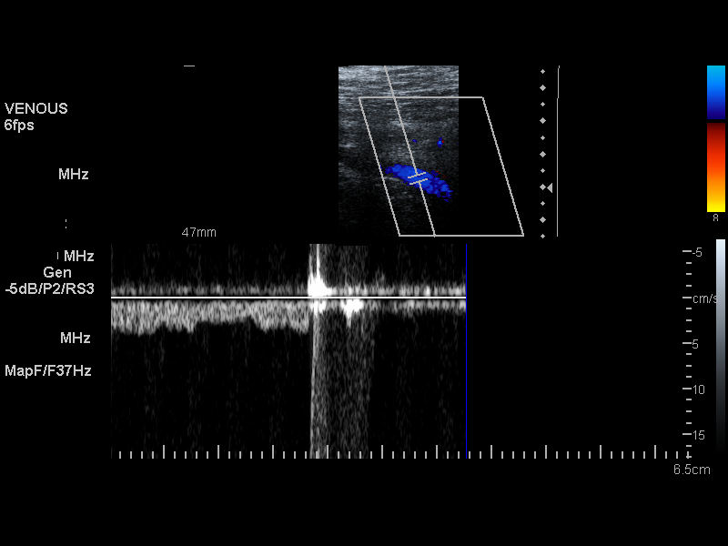
[im 22/29]
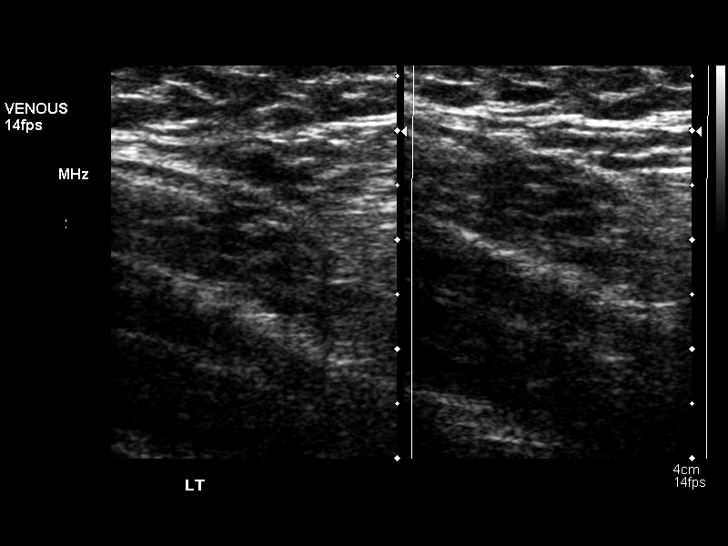
[im 24/29]
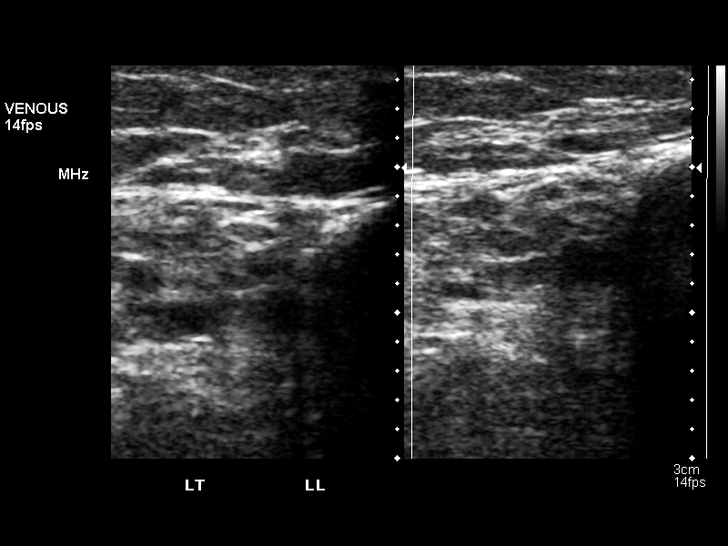
[im 26/29]
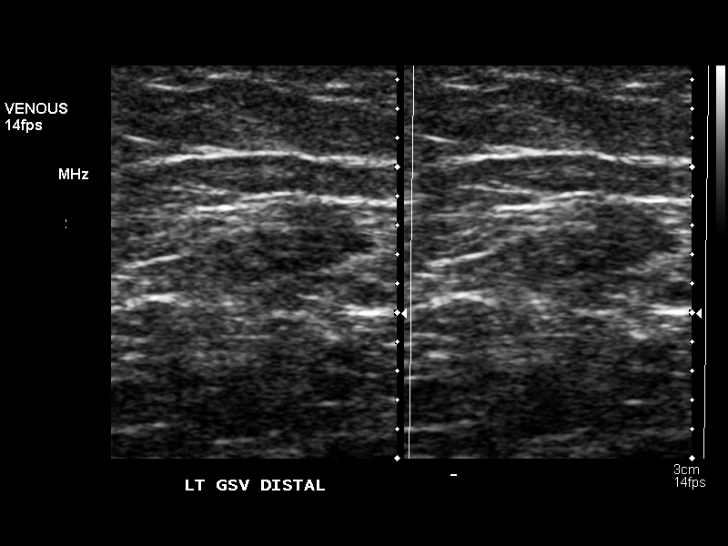
[im 29/29]
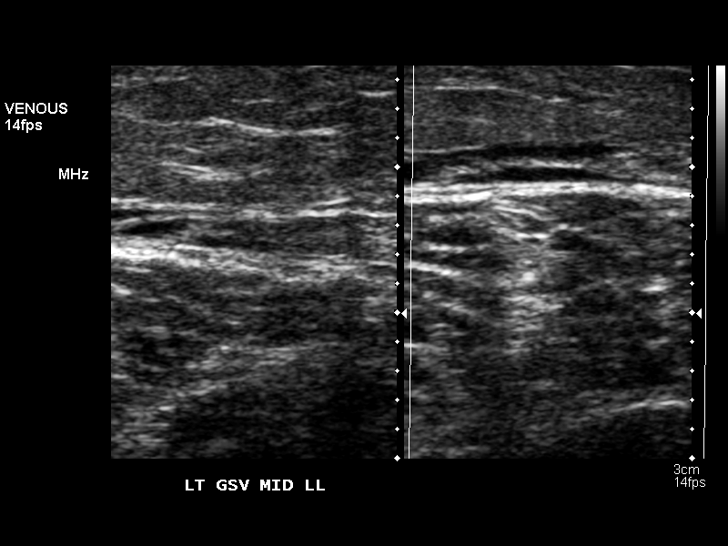

[14 of 24 positions shown; findings below may reference images not displayed]

FINDINGS: There is complete compressibility of the left common
femoral, femoral, and popliteal veins.  Doppler analysis
demonstrates respiratory phasicity and augmentation of flow upon
calf compression.  The greater saphenous and tibial veins are
poorly visualized.  The visualized portions of the greater
saphenous vein do compress normally.
IMPRESSION: No evidence of left lower extremity DVT.
